# Patient Record
Sex: Female | Born: 1975 | Race: Asian | Hispanic: No | Marital: Married | Smoking: Never smoker
Health system: Southern US, Community
[De-identification: ages and names within clinical notes are randomized; demographics above are authoritative.]

## PROBLEM LIST (undated history)

## (undated) DIAGNOSIS — L811 Chloasma: Secondary | ICD-10-CM

## (undated) HISTORY — DX: Chloasma: L81.1

---

## 2011-07-28 ENCOUNTER — Ambulatory Visit: Payer: Self-pay | Admitting: Family Medicine

## 2019-02-10 ENCOUNTER — Telehealth: Payer: Self-pay | Admitting: Obstetrics and Gynecology

## 2019-02-10 NOTE — Telephone Encounter (Signed)
Patient is schedule for 02/21/19 with [facility] for Mirena replacement

## 2019-02-11 NOTE — Telephone Encounter (Signed)
Patient reschedule to 8:50 on 02/21/19

## 2019-02-16 NOTE — Telephone Encounter (Signed)
Mirena reserved for this patient. 

## 2019-02-21 ENCOUNTER — Ambulatory Visit: Payer: Self-pay | Admitting: Obstetrics and Gynecology

## 2019-02-21 ENCOUNTER — Encounter: Payer: Self-pay | Admitting: Obstetrics and Gynecology

## 2019-02-21 ENCOUNTER — Ambulatory Visit (INDEPENDENT_AMBULATORY_CARE_PROVIDER_SITE_OTHER): Payer: 59 | Admitting: Obstetrics and Gynecology

## 2019-02-21 ENCOUNTER — Other Ambulatory Visit: Payer: Self-pay

## 2019-02-21 VITALS — BP 110/80 | Ht 61.0 in | Wt 118.4 lb

## 2019-02-21 DIAGNOSIS — Z30433 Encounter for removal and reinsertion of intrauterine contraceptive device: Secondary | ICD-10-CM

## 2019-02-21 MED ORDER — LEVONORGESTREL 20 MCG/24HR IU IUD: 1.0000 | INTRAUTERINE_SYSTEM | Freq: Once | INTRAUTERINE | 0 refills | Status: AC

## 2019-02-21 NOTE — Patient Instructions (Signed)
I value your feedback and entrusting us with your care. If you get a [place] patient survey, I would appreciate you taking the time to let us know about your experience today. Thank you!  [facility] [phone]  Instructions after IUD insertion  Most women experience no significant problems after insertion of an IUD, however minor cramping and spotting for a few days is common. Cramps may be treated with ibuprofen 800mg every 8 hours or Tylenol 650 mg every 4 hours. Contact [facility] immediately if you experience any of the following symptoms during the next week: temperature >99.6 degrees, worsening pelvic pain, abdominal pain, fainting, unusually heavy vaginal bleeding, foul vaginal discharge, or if you think you have expelled the IUD.  Nothing inserted in the vagina for 48 hours. You will be scheduled for a follow up visit in approximately four weeks.  You should check monthly to be sure you can feel the IUD strings in the upper vagina. If you are having a monthly period, try to check after each period. If you cannot feel the IUD strings,  contact [facility] immediately so we can do an exam to determine if the IUD has been expelled.   Please use backup protection until we can confirm the IUD is in place.  Call [facility] if you are exposed to or diagnosed with a sexually transmitted infection, as we will need to discuss whether it is safe for you to continue using an IUD.   

## 2019-02-21 NOTE — Progress Notes (Signed)
    Chief Complaint  Patient presents with  . Contraception    Mirena removal/reinsertion     History of Present Illness:  Leah Moran is a 43 y.o. that had a Mirena IUD placed approximately 5 years ago. Since that time, she denies dyspareunia, pelvic pain, non-menstrual bleeding, vaginal d/c, heavy bleeding. She would like a replacement IUD. Past due for annual.   BP 110/80   Ht 5\' 1"  (1.549 m)   Wt 118 lb 6.4 oz (53.7 kg)   BMI 22.37 kg/m   Pelvic exam:  Two IUD strings present seen coming from the cervical os. EGBUS, vaginal vault and cervix: within normal limits  IUD Removal Strings of IUD identified and grasped.  IUD removed without problem with ring forceps.  Pt tolerated this well.  IUD noted to be intact.   IUD Insertion Procedure Note Patient identified, informed consent performed, consent signed.   Discussed risks of irregular bleeding, cramping, infection, malpositioning or misplacement of the IUD outside the uterus which may require further procedure such as laparoscopy, risk of failure <1%. Time out was performed.    Speculum placed in the vagina.  Cervix visualized.  Cleaned with Betadine x 2.  Grasped anteriorly with a single tooth tenaculum.  Uterus sounded to 10.0 cm.   IUD placed per manufacturer's recommendations.  Strings trimmed to 3 cm. Tenaculum was removed, good hemostasis noted.  Patient tolerated procedure well.   ASSESSMENT:  Encounter for removal and reinsertion of intrauterine contraceptive device (IUD) - Plan: levonorgestrel (MIRENA) 20 MCG/24HR IUD   Meds ordered this encounter  Medications  . levonorgestrel (MIRENA) 20 MCG/24HR IUD    Sig: 1 Intra Uterine Device (1 each total) by Intrauterine route once for 1 dose.    Dispense:  1 each    Refill:  0    Order Specific Question:   Supervising Provider    Answer:   Gae Dry [[id number]]     Plan:  Patient was given post-procedure instructions.  She was advised to have backup  contraception for one week.   Call if you are having increasing pain, cramps or bleeding or if you have a fever greater than 100.4 degrees F., shaking chills, nausea or vomiting. Patient was also asked to check IUD strings periodically and follow up in 4 weeks for IUD check.  Return in about 4 weeks (around 03/21/2019) for annual with IUD f/u.  Alicia B. Copland, PA-C 02/21/2019 9:12 AM

## 2019-03-30 NOTE — Progress Notes (Signed)
PCP:  Patient, No Pcp Per   Chief Complaint  Patient presents with  . Gynecologic Exam     HPI:      Ms. Leah Moran is a 43 y.o. [phone] who LMP was No LMP recorded. (Menstrual status: IUD)., presents today for her annual examination.  Her menses are absent with IUD. Dysmenorrhea none. She does not have intermenstrual bleeding.  Sex activity: single partner, contraception - IUD. Mirena replaced 02/21/19. Doing well.  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 There is no FH of breast cancer. There is no FH of ovarian cancer. The patient does do self-breast exams.  Tobacco use: The patient denies current or previous tobacco use. Alcohol use: social drinker No drug use.  Exercise: very active  She does get adequate calcium and Vitamin D in her diet.  No recent fasting labs. Pt with hx of melasma. Needs Rx RF tri-luma crm.  Past Medical History:  Diagnosis Date  . Melasma     History reviewed. No pertinent surgical history.  Family History  Problem Relation Age of Onset  . Other Mother        Ruptured anuerysm  . Hypertension Mother   . Breast cancer Neg Hx   . Ovarian cancer Neg Hx     Social History   Socioeconomic History  . Marital status: Married    Spouse name: Not on file  . Number of children: Not on file  . Years of education: Not on file  . Highest education level: Not on file  Occupational History  . Not on file  Social Needs  . Financial resource strain: Not on file  . Food insecurity    Worry: Not on file    Inability: Not on file  . Transportation needs    Medical: Not on file    Non-medical: Not on file  Tobacco Use  . Smoking status: Never Smoker  . Smokeless tobacco: Never Used  Substance and Sexual Activity  . Alcohol use: Yes  . Drug use: Never  . Sexual activity: Yes    Birth control/protection: I.U.D.   Comment: Mirena  Lifestyle  . Physical activity    Days per week: Not on file    Minutes per session: Not on file  . Stress: Not on file  Relationships  . Social Musicianconnections    Talks on phone: Not on file    Gets together: Not on file    Attends religious service: Not on file    Active member of club or organization: Not on file    Attends meetings of clubs or organizations: Not on file    Relationship status: Not on file  . Intimate partner violence    Fear of current or ex partner: Not on file    Emotionally abused: Not on file    Physically abused: Not on file    Forced sexual activity: Not on file  Other Topics Concern  . Not on file  Social History Narrative  . Not on file    Outpatient Medications Prior to Visit  Medication Sig Dispense Refill  . levonorgestrel (MIRENA) 20 MCG/24HR IUD 1 Intra Uterine Device (1 each total) by Intrauterine route once for 1 dose. 1 each 0   No facility-administered medications prior to visit.       ROS:  Review of  Systems  Constitutional: Negative for fatigue, fever and unexpected weight change.  Respiratory: Negative for cough, shortness of breath and wheezing.   Cardiovascular: Negative for chest pain, palpitations and leg swelling.  Gastrointestinal: Negative for blood in stool, constipation, diarrhea, nausea and vomiting.  Endocrine: Negative for cold intolerance, heat intolerance and polyuria.  Genitourinary: Negative for dyspareunia, dysuria, flank pain, frequency, genital sores, hematuria, menstrual problem, pelvic pain, urgency, vaginal bleeding, vaginal discharge and vaginal pain.  Musculoskeletal: Negative for back pain, joint swelling and myalgias.  Skin: Negative for rash.  Neurological: Negative for dizziness, syncope, light-headedness, numbness and headaches.  Hematological: Negative for adenopathy.  Psychiatric/Behavioral: Negative for agitation, confusion, sleep disturbance and suicidal ideas. The patient is not  nervous/anxious.    BREAST: No symptoms   Objective: BP 100/60   Ht 5' (1.524 m)   Wt 114 lb 9.6 oz (52 kg)   BMI 22.38 kg/m    Physical Exam Constitutional:      Appearance: She is well-developed.  Genitourinary:     Vulva, vagina, cervix, uterus, right adnexa and left adnexa normal.     No vulval lesion or tenderness noted.     No vaginal discharge, erythema or tenderness.     No cervical polyp.     IUD strings visualized.     Uterus is not enlarged or tender.     No right or left adnexal mass present.     Right adnexa not tender.     Left adnexa not tender.  Neck:     Musculoskeletal: Normal range of motion.     Thyroid: No thyromegaly.  Cardiovascular:     Rate and Rhythm: Normal rate and regular rhythm.     Heart sounds: Normal heart sounds. No murmur.  Pulmonary:     Effort: Pulmonary effort is normal.     Breath sounds: Normal breath sounds.  Chest:     Breasts:        Right: No mass, nipple discharge, skin change or tenderness.        Left: No mass, nipple discharge, skin change or tenderness.  Abdominal:     Palpations: Abdomen is soft.     Tenderness: There is no abdominal tenderness. There is no guarding.  Musculoskeletal: Normal range of motion.  Neurological:     General: No focal deficit present.     Mental Status: She is alert and oriented to person, place, and time.     Cranial Nerves: No cranial nerve deficit.  Skin:    General: Skin is warm and dry.  Psychiatric:        Mood and Affect: Mood normal.        Behavior: Behavior normal.        Thought Content: Thought content normal.        Judgment: Judgment normal.  Vitals signs reviewed.     Assessment/Plan: Encounter for annual routine gynecological examination -   Cervical cancer screening - Plan: Cytology - PAP,   Screening for HPV (human papillomavirus) - Plan: Cytology - PAP,   Screening for breast cancer - Plan: MM 3D SCREEN BREAST BILATERAL, Pt to sched mammo  Blood tests for  routine general physical examination - Plan: Comprehensive metabolic panel, Lipid panel,   Screening cholesterol level - Plan: Lipid panel,   Melasma - Plan: Fluocin-Hydroquinone-Tretinoin (TRI-LUMA) 0.01-4-0.05 % CREA, Rx eRdx  Meds ordered this encounter  Medications  . Fluocin-Hydroquinone-Tretinoin (TRI-LUMA) 0.01-4-0.05 % CREA    Sig: AAA nightly for up to 8  wks, then take a short break prn    Dispense:  30 g    Refill:  1    Order Specific Question:   Supervising Provider    Answer:   Gae Dry [[id number]]             GYN counsel breast self exam, mammography screening, adequate intake of calcium and vitamin D     F/U  Return in about 1 year (around 03/30/2020).  Amberia Bayless B. Kerston Landeck, PA-C 03/31/2019 8:38 AM

## 2019-03-30 NOTE — Patient Instructions (Addendum)
I value your feedback and entrusting us with your care. If you get a [facility] patient survey, I would appreciate you taking the time to let us know about your experience today. Thank you!  [facility]: [phone]  [facility]: [phone]  

## 2019-03-31 ENCOUNTER — Ambulatory Visit (INDEPENDENT_AMBULATORY_CARE_PROVIDER_SITE_OTHER): Payer: 59 | Admitting: Obstetrics and Gynecology

## 2019-03-31 ENCOUNTER — Encounter: Payer: Self-pay | Admitting: Obstetrics and Gynecology

## 2019-03-31 ENCOUNTER — Other Ambulatory Visit (HOSPITAL_COMMUNITY)
Admission: RE | Admit: 2019-03-31 | Discharge: 2019-03-31 | Disposition: A | Discharge status: Home / Self Care | Payer: 59 | Source: Ambulatory Visit | Attending: Obstetrics and Gynecology | Admitting: Obstetrics and Gynecology

## 2019-03-31 ENCOUNTER — Other Ambulatory Visit: Payer: Self-pay

## 2019-03-31 VITALS — BP 100/60 | Ht 60.0 in | Wt 114.6 lb

## 2019-03-31 DIAGNOSIS — Z1151 Encounter for screening for human papillomavirus (HPV): Secondary | ICD-10-CM | POA: Insufficient documentation

## 2019-03-31 DIAGNOSIS — Z0000 Encounter for general adult medical examination without abnormal findings: Secondary | ICD-10-CM

## 2019-03-31 DIAGNOSIS — Z01419 Encounter for gynecological examination (general) (routine) without abnormal findings: Secondary | ICD-10-CM

## 2019-03-31 DIAGNOSIS — Z13220 Encounter for screening for lipoid disorders: Secondary | ICD-10-CM

## 2019-03-31 DIAGNOSIS — Z124 Encounter for screening for malignant neoplasm of cervix: Secondary | ICD-10-CM

## 2019-03-31 DIAGNOSIS — Z1239 Encounter for other screening for malignant neoplasm of breast: Secondary | ICD-10-CM

## 2019-03-31 DIAGNOSIS — L811 Chloasma: Secondary | ICD-10-CM

## 2019-03-31 MED ORDER — TRI-LUMA 0.01-4-0.05 % EX CREA: TOPICAL_CREAM | CUTANEOUS | 1 refills | Status: DC

## 2019-04-01 LAB — COMPREHENSIVE METABOLIC PANEL
ALT: 11 IU/L (ref 0–32)
AST: 16 IU/L (ref 0–40)
Albumin/Globulin Ratio: 2.3 — ABNORMAL HIGH (ref 1.2–2.2)
Albumin: 4.8 g/dL (ref 3.8–4.8)
Alkaline Phosphatase: 47 IU/L (ref 39–117)
BUN/Creatinine Ratio: 14 (ref 9–23)
BUN: 13 mg/dL (ref 6–24)
Bilirubin Total: 0.6 mg/dL (ref 0.0–1.2)
CO2: 22 mmol/L (ref 20–29)
Calcium: 9.2 mg/dL (ref 8.7–10.2)
Chloride: 103 mmol/L (ref 96–106)
Creatinine, Ser: 0.91 mg/dL (ref 0.57–1.00)
GFR calc Af Amer: 90 mL/min/{1.73_m2} (ref >59)
GFR calc non Af Amer: 78 mL/min/{1.73_m2} (ref >59)
Globulin, Total: 2.1 g/dL (ref 1.5–4.5)
Glucose: 97 mg/dL (ref 65–99)
Potassium: 3.7 mmol/L (ref 3.5–5.2)
Sodium: 141 mmol/L (ref 134–144)
Total Protein: 6.9 g/dL (ref 6.0–8.5)

## 2019-04-01 LAB — LIPID PANEL
Chol/HDL Ratio: 3.1 ratio (ref 0.0–4.4)
Cholesterol, Total: 224 mg/dL — ABNORMAL HIGH (ref 100–199)
HDL: 72 mg/dL (ref >39)
LDL Calculated: 137 mg/dL — ABNORMAL HIGH (ref 0–99)
Triglycerides: 74 mg/dL (ref 0–149)
VLDL Cholesterol Cal: 15 mg/dL (ref 5–40)

## 2019-04-05 ENCOUNTER — Encounter: Payer: Self-pay | Admitting: Obstetrics and Gynecology

## 2019-04-05 LAB — CYTOLOGY - PAP
Diagnosis: UNDETERMINED — AB
HPV: NOT DETECTED

## 2019-04-14 NOTE — Telephone Encounter (Signed)
Rcvd/charge Mirena, Removal & reinsert 02/21/2019

## 2019-04-21 ENCOUNTER — Other Ambulatory Visit: Payer: Self-pay

## 2019-04-21 ENCOUNTER — Ambulatory Visit
Admission: RE | Admit: 2019-04-21 | Discharge: 2019-04-21 | Disposition: A | Discharge status: Home / Self Care | Payer: 59 | Source: Ambulatory Visit | Attending: Obstetrics and Gynecology | Admitting: Obstetrics and Gynecology

## 2019-04-21 DIAGNOSIS — Z1231 Encounter for screening mammogram for malignant neoplasm of breast: Secondary | ICD-10-CM | POA: Insufficient documentation

## 2019-04-21 DIAGNOSIS — Z1239 Encounter for other screening for malignant neoplasm of breast: Secondary | ICD-10-CM

## 2019-04-22 ENCOUNTER — Encounter: Payer: Self-pay | Admitting: Obstetrics and Gynecology

## 2020-09-25 DIAGNOSIS — Z20828 Contact with and (suspected) exposure to other viral communicable diseases: Secondary | ICD-10-CM | POA: Diagnosis not present

## 2020-11-02 IMAGING — MG DIGITAL SCREENING BILATERAL MAMMOGRAM WITH TOMO AND CAD
8 series · 9 of 24 positions shown · non-contrast
Comparison: Previous exam(s).

CLINICAL DATA: Screening.

EXAM:
DIGITAL SCREENING BILATERAL MAMMOGRAM WITH TOMO AND CAD

[R CC synth-2D]
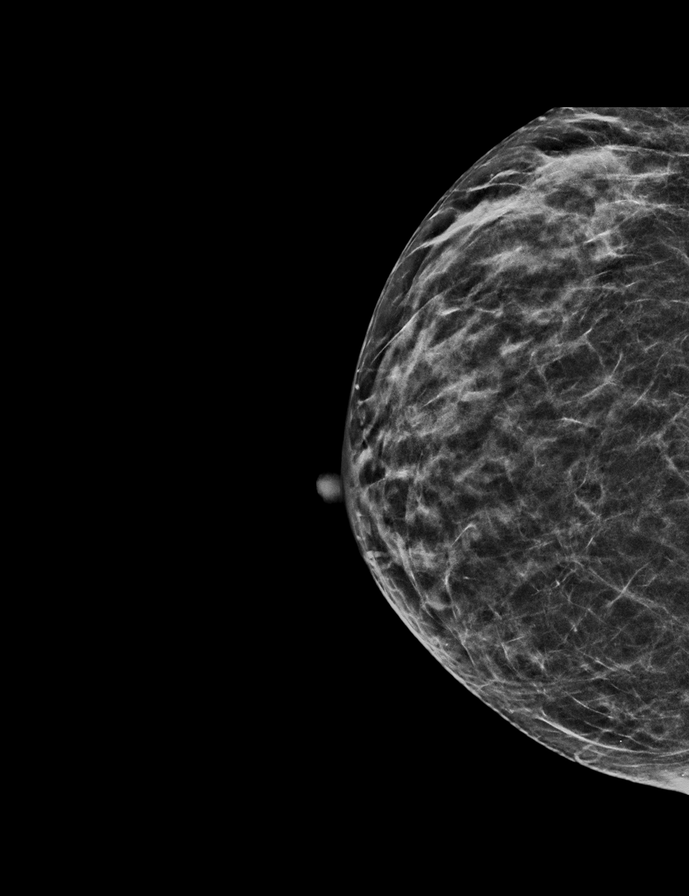

[R MLO synth-2D]
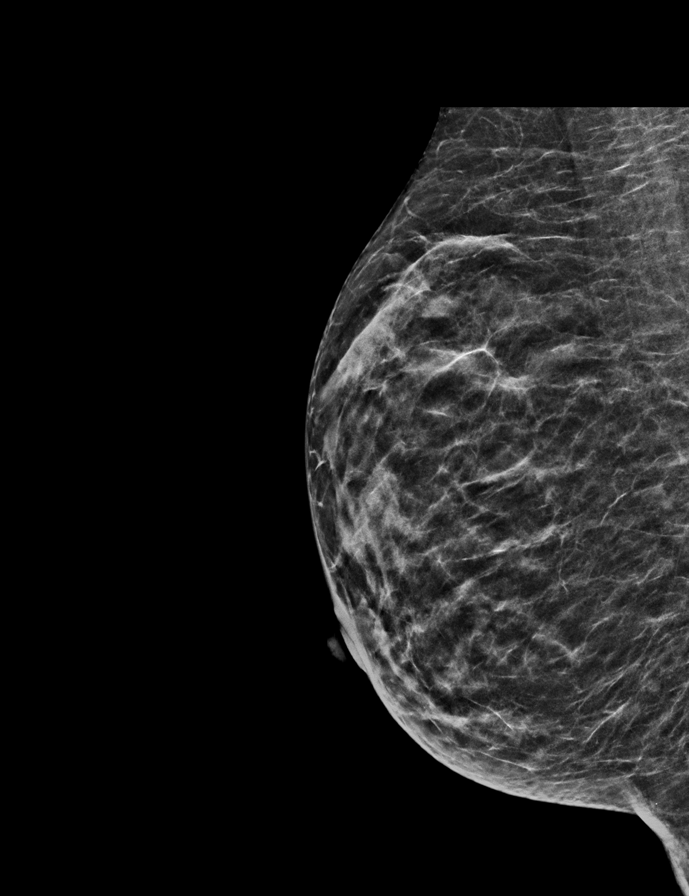

[L MLO synth-2D]
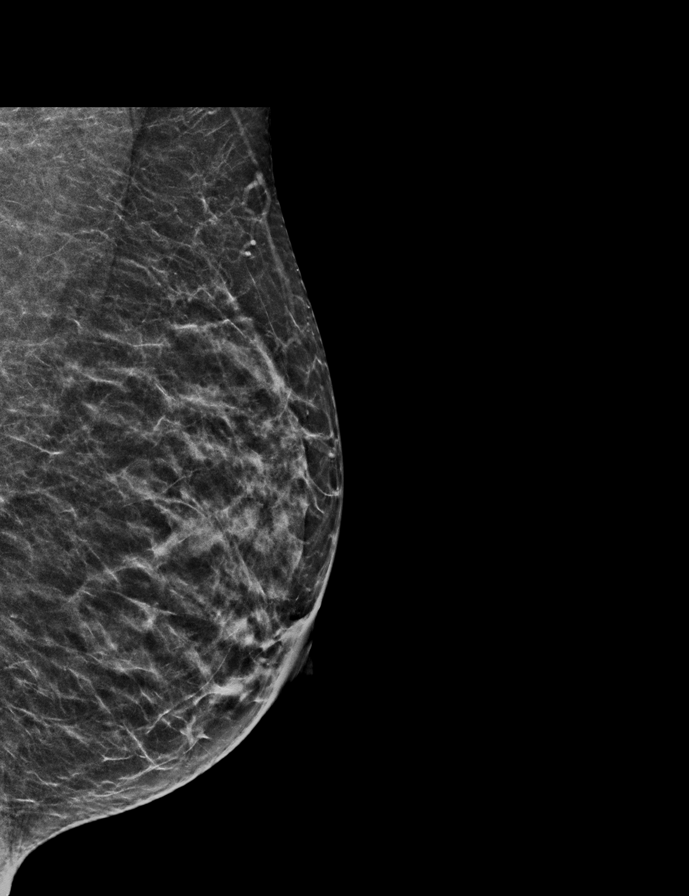

[L CC synth-2D]
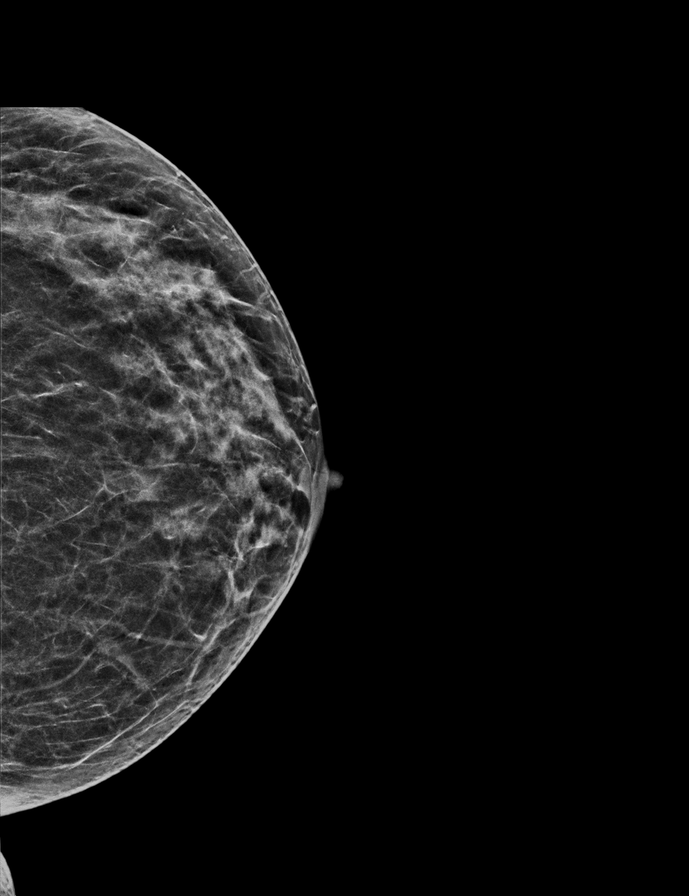

[R CC tomo · 2 of 45 frames shown]
[frame 15/45]
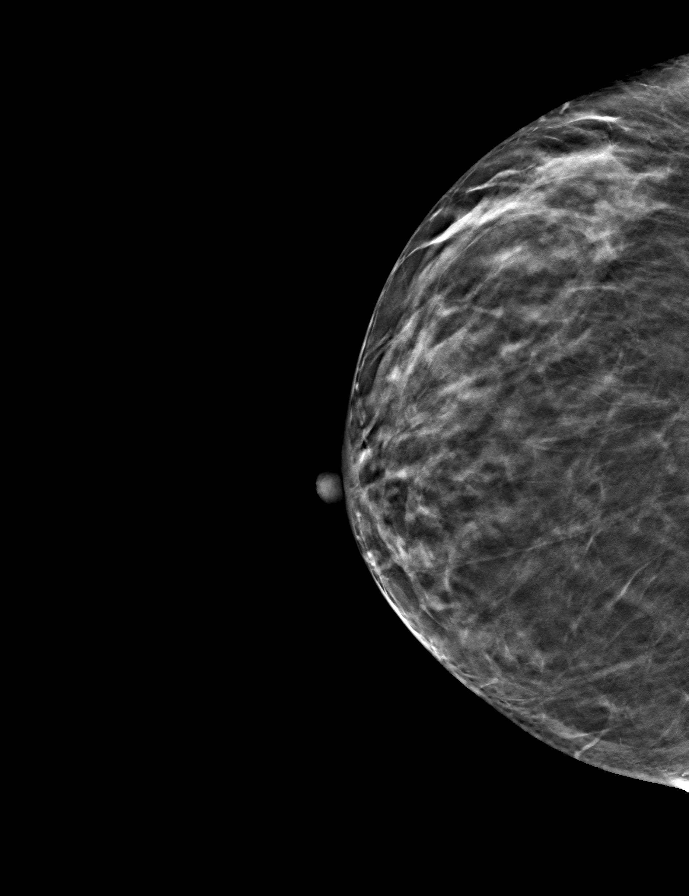
[frame 23/45]
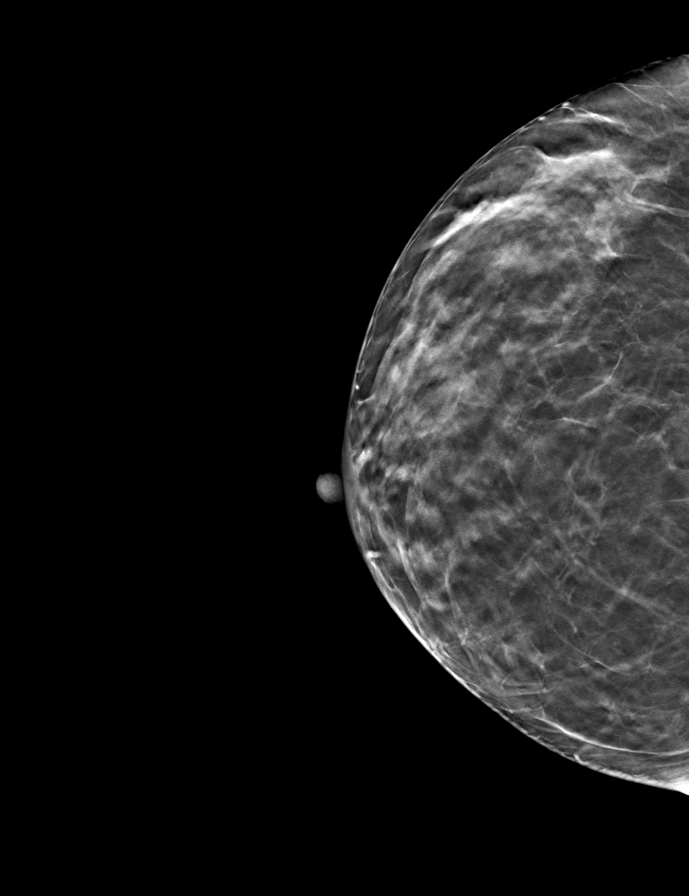

[L MLO tomo · tomo slice 26/51.0]
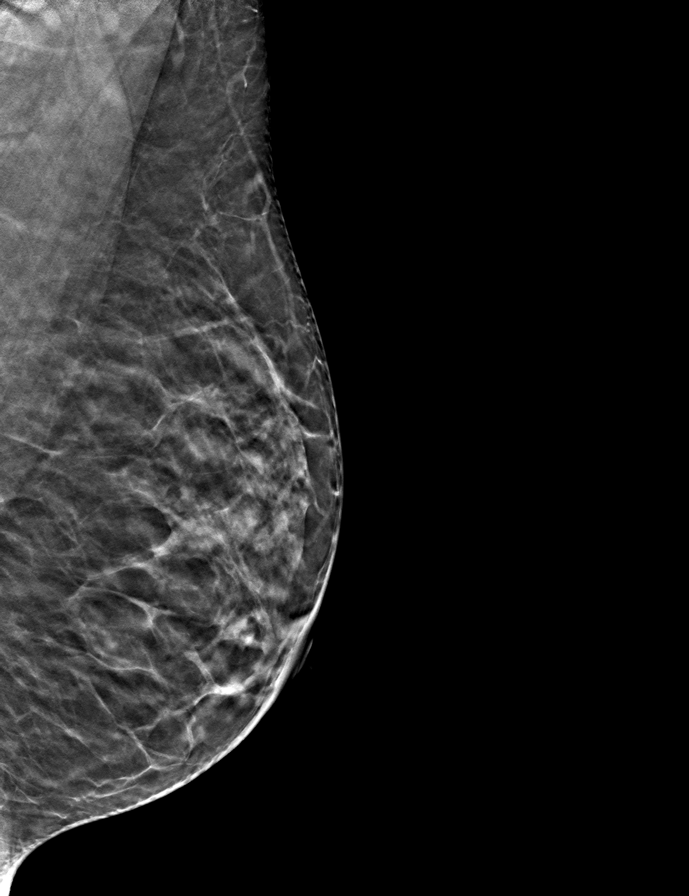

[R MLO tomo · tomo slice 23/46.0]
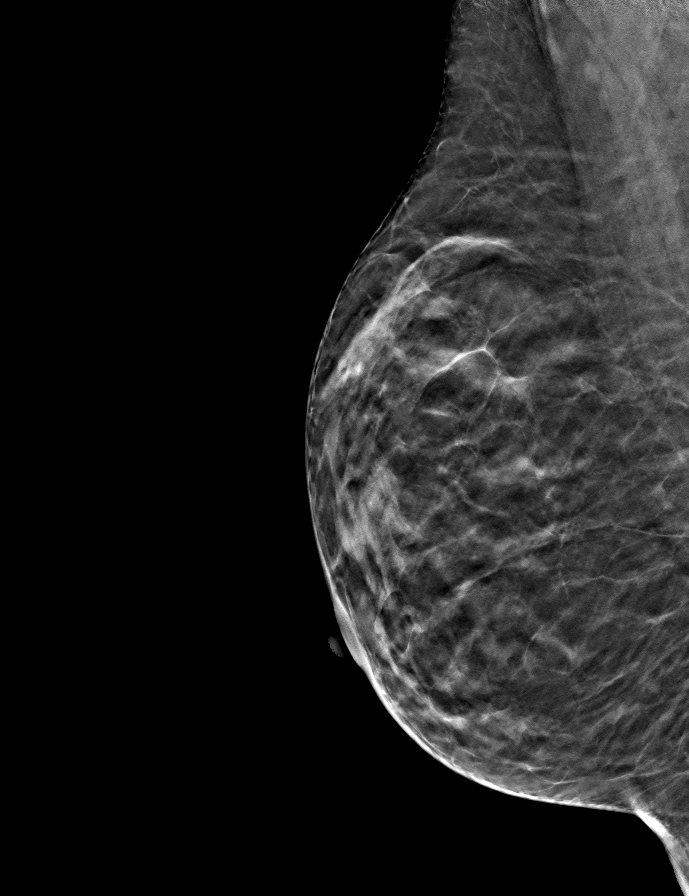

[L CC tomo · tomo slice 25/48.0]
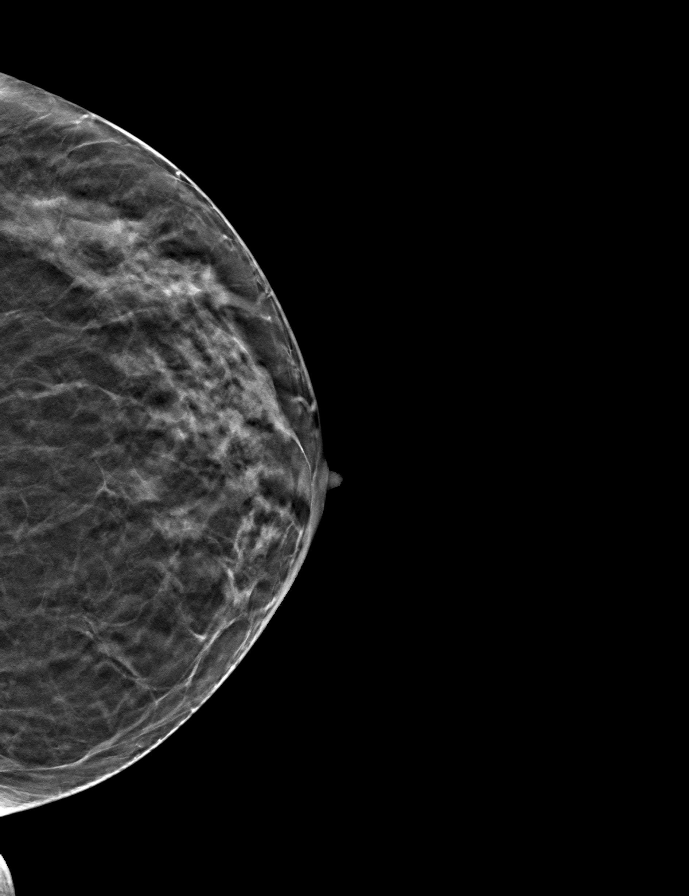

[9 of 24 positions shown; findings below may reference images not displayed]

ACR Breast Density Category c: The breast tissue is heterogeneously
dense, which may obscure small masses.
FINDINGS: There are no findings suspicious for malignancy. Images were
processed with CAD.
IMPRESSION: No mammographic evidence of malignancy. A result letter of this
screening mammogram will be mailed directly to the patient.

RECOMMENDATION:
Screening mammogram in one year. (Code:FT-U-LHB)

BI-RADS CATEGORY  1: Negative.

## 2021-03-12 ENCOUNTER — Ambulatory Visit (INDEPENDENT_AMBULATORY_CARE_PROVIDER_SITE_OTHER): Payer: BC Managed Care – PPO | Admitting: Family Medicine

## 2021-03-12 ENCOUNTER — Encounter (HOSPITAL_BASED_OUTPATIENT_CLINIC_OR_DEPARTMENT_OTHER): Payer: Self-pay | Admitting: Family Medicine

## 2021-03-12 ENCOUNTER — Other Ambulatory Visit: Payer: Self-pay

## 2021-03-12 DIAGNOSIS — R079 Chest pain, unspecified: Secondary | ICD-10-CM | POA: Diagnosis not present

## 2021-03-12 DIAGNOSIS — E785 Hyperlipidemia, unspecified: Secondary | ICD-10-CM

## 2021-03-12 NOTE — Progress Notes (Signed)
New Patient Office Visit  Subjective:  Patient ID: Leah Moran, female    DOB: 1976/05/18  Age: 45 y.o. MRN: [id number]  CC:  Chief Complaint  Patient presents with   Establish Care    Patient presents to establish care. Patient admits to having OB/GYN acting as PCP previously   Chest Pain    Patient presents with concerns of intermittent chest pain for the last 6 months. Patient states she is an avid runner and notices 2-3 times weekly when she is running she will get a sharp shooting pain in the left center of her chest. Pain is not radiating. Patient denies any dizziness, shortness of breath, lightheadedness, or other associated symptoms.    HPI Leah BALLENGEE is a 45 year old female presenting to establish in clinic.  She reports current issues with infrequent chest pain.  Otherwise she denies significant past medical history, no prior hospitalizations or surgeries.  Chest pain: She has noticed this over the past 6 months approximately.  Will occur may be twice per week and last less than 30 minutes.  She denies any associated symptoms with the chest pain such as shortness of breath, lightheadedness or dizziness.  Patient runs fairly regularly, completing about 6 to 10 miles per week.  She will occasionally notice the pain when she is running, she will also noticed that at other times the day both with exertion and without exertion.  She denies any family history of cardiac conditions or death from an unknown cause at a young age.  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  She has been living locally for about 13 years.  Present activity primarily includes running.  Runs about 6 to 10 miles per week.  Past Medical History:  Diagnosis Date   Melasma     History reviewed. No pertinent surgical history.  Family History  Problem Relation Age of Onset   Other Mother        Ruptured anuerysm   Hypertension Mother    Anuerysm Mother    Anuerysm Maternal Grandmother     Cancer Paternal Grandmother    Breast cancer Neg Hx    Ovarian cancer Neg Hx     Social History   Socioeconomic History   Marital status: Married    Spouse name: Not on file   Number of children: Not on file   Years of education: Not on file   Highest education level: Not on file  Occupational History   Not on file  Tobacco Use   Smoking status: Never    Passive exposure: Never   Smokeless tobacco: Never  Vaping Use   Vaping Use: Never used  Substance and Sexual Activity   Alcohol use: Yes    Alcohol/week: 1.0 - 2.0 standard drink    Types: 1 - 2 Standard drinks or equivalent per week    Comment: socially on weekends   Drug use: Never   Sexual activity: Yes    Birth control/protection: I.U.D.    Comment: Mirena  Other Topics Concern   Not on file  Social History Narrative   Not on file   Social Determinants of Health   Financial Resource Strain: Not on file  Food Insecurity: Not on file  Transportation Needs: Not on file  Physical Activity: Not on file  Stress: Not on file  Social Connections: Not on file  Intimate Partner Violence: Not on file    Objective:   Today's Vitals: BP 122/64   Pulse  67   Ht 5' (1.524 m)   Wt 115 lb 12.8 oz (52.5 kg)   SpO2 98%   BMI 22.62 kg/m   Physical Exam  Pleasant 45 year old female in no acute distress Cardiovascular exam with regular rate and rhythm, no murmurs appreciated Lungs clear to auscultation bilaterally  Assessment & Plan:   Problem List Items Addressed This Visit       Other   Hyperlipidemia    Found to have elevated total cholesterol and LDL on labs completed about 2 years ago Will check lipid panel today Did discuss general lifestyle modifications including diet and exercise.       Relevant Orders   Lipid panel   Chest pain    Uncertain etiology, atypical in nature.  Possible precordial catch syndrome. Will check labs including CBC, BMP, lipid panel Discussed possible EKG, patient wishes  to hold off for now Advised to monitor symptoms and return to clinic if symptoms worsen in severity or become more frequent or last longer in duration       Relevant Orders   CBC with Differential/Platelet   Basic metabolic panel    Outpatient Encounter Medications as of 03/12/2021  Medication Sig   levonorgestrel (MIRENA) 20 MCG/24HR IUD 1 Intra Uterine Device (1 each total) by Intrauterine route once for 1 dose.   [DISCONTINUED] Fluocin-Hydroquinone-Tretinoin (TRI-LUMA) 0.01-4-0.05 % CREA AAA nightly for up to 8 wks, then take a short break prn   No facility-administered encounter medications on file as of 03/12/2021.    Follow-up: Return in about 1 year (around 03/12/2022) for Follow Up.   Leah Harold J De [place], MD

## 2021-03-12 NOTE — Patient Instructions (Signed)
  Medication Instructions:  Your physician recommends that you continue on your current medications as directed. Please refer to the Current Medication list given to you today. --If you need a refill on any your medications before your next appointment, please call your pharmacy first. If no refills are authorized on file call the office.--  Lab Work: Your physician has recommended that you have lab work today: CBC, BMET, and Lipid Profile If you have labs (blood work) drawn today and your tests are completely normal, you will receive your results via MyChart message OR a phone call from our staff.  Please ensure you check your voicemail in the event that you authorized detailed messages to be left on a delegated number. If you have any lab test that is abnormal or we need to change your treatment, we will call you to review the results.  Follow-Up: Your next appointment:   Your physician recommends that you schedule a follow-up appointment in: 1 YEAR with Dr. de Cuba  Thanks for letting us be apart of your health journey!!  Primary Care and Sports Medicine   Dr. Raymond de Cuba   We encourage you to activate your patient portal called "MyChart".  Sign up information is provided on this After Visit Summary.  MyChart is used to connect with patients for Virtual Visits (Telemedicine).  Patients are able to view lab/test results, encounter notes, upcoming appointments, etc.  Non-urgent messages can be sent to your provider as well. To learn more about what you can do with MyChart, please visit --  [email or web address].    

## 2021-03-12 NOTE — Assessment & Plan Note (Signed)
Found to have elevated total cholesterol and LDL on labs completed about 2 years ago Will check lipid panel today Did discuss general lifestyle modifications including diet and exercise.

## 2021-03-12 NOTE — Assessment & Plan Note (Signed)
Uncertain etiology, atypical in nature.  Possible precordial catch syndrome. Will check labs including CBC, BMP, lipid panel Discussed possible EKG, patient wishes to hold off for now Advised to monitor symptoms and return to clinic if symptoms worsen in severity or become more frequent or last longer in duration

## 2021-03-13 LAB — CBC WITH DIFFERENTIAL/PLATELET
Basophils Absolute: 0 10*3/uL (ref 0.0–0.2)
Basos: 1 %
EOS (ABSOLUTE): 0.1 10*3/uL (ref 0.0–0.4)
Eos: 1 %
Hematocrit: 42.3 % (ref 34.0–46.6)
Hemoglobin: 14.2 g/dL (ref 11.1–15.9)
Immature Grans (Abs): 0 10*3/uL (ref 0.0–0.1)
Immature Granulocytes: 1 %
Lymphocytes Absolute: 2.3 10*3/uL (ref 0.7–3.1)
Lymphs: 53 %
MCH: 29.8 pg (ref 26.6–33.0)
MCHC: 33.6 g/dL (ref 31.5–35.7)
MCV: 89 fL (ref 79–97)
Monocytes Absolute: 0.4 10*3/uL (ref 0.1–0.9)
Monocytes: 9 %
Neutrophils Absolute: 1.5 10*3/uL (ref 1.4–7.0)
Neutrophils: 35 %
Platelets: 309 10*3/uL (ref 150–450)
RBC: 4.76 x10E6/uL (ref 3.77–5.28)
RDW: 12.1 % (ref 11.7–15.4)
WBC: 4.3 10*3/uL (ref 3.4–10.8)

## 2021-03-13 LAB — BASIC METABOLIC PANEL
BUN/Creatinine Ratio: 14 (ref 9–23)
BUN: 10 mg/dL (ref 6–24)
CO2: 22 mmol/L (ref 20–29)
Calcium: 9.4 mg/dL (ref 8.7–10.2)
Chloride: 98 mmol/L (ref 96–106)
Creatinine, Ser: 0.71 mg/dL (ref 0.57–1.00)
Glucose: 95 mg/dL (ref 65–99)
Potassium: 4.1 mmol/L (ref 3.5–5.2)
Sodium: 137 mmol/L (ref 134–144)
eGFR: 107 mL/min/{1.73_m2} (ref >59)

## 2021-03-13 LAB — LIPID PANEL
Chol/HDL Ratio: 3.7 ratio (ref 0.0–4.4)
Cholesterol, Total: 214 mg/dL — ABNORMAL HIGH (ref 100–199)
HDL: 58 mg/dL (ref >39)
LDL Chol Calc (NIH): 139 mg/dL — ABNORMAL HIGH (ref 0–99)
Triglycerides: 97 mg/dL (ref 0–149)
VLDL Cholesterol Cal: 17 mg/dL (ref 5–40)

## 2021-03-25 ENCOUNTER — Telehealth (HOSPITAL_BASED_OUTPATIENT_CLINIC_OR_DEPARTMENT_OTHER): Payer: Self-pay

## 2021-03-25 NOTE — Telephone Encounter (Signed)
-----   Message from Hosie Poisson Peru, MD sent at 03/13/2021  1:35 PM EDT ----- Normal white blood cell and red blood cell counts with normal hemoglobin.  Electrolytes, kidney function are normal. Lipid panel with elevated total cholesterol and elevated LDL.  Would initially focus on lifestyle modifications to address cholesterol issues.  This will include dietary changes - incorporating fresh fruits and vegetables, lean protein in the diet and reducing consumption of red meats, saturated fats, processed foods.  Recommend continuing with regular aerobic exercise - goal of about 150 minutes/week.

## 2021-03-25 NOTE — Telephone Encounter (Signed)
Per DPR left detailed message on mobile voicemail Instructed patient to contact office with any questions or concerns.

## 2021-08-15 ENCOUNTER — Encounter: Payer: Self-pay | Admitting: Obstetrics and Gynecology

## 2021-08-15 DIAGNOSIS — Z1231 Encounter for screening mammogram for malignant neoplasm of breast: Secondary | ICD-10-CM | POA: Diagnosis not present

## 2022-03-13 ENCOUNTER — Encounter (HOSPITAL_BASED_OUTPATIENT_CLINIC_OR_DEPARTMENT_OTHER): Payer: BC Managed Care – PPO | Admitting: Family Medicine

## 2022-07-12 DIAGNOSIS — J45901 Unspecified asthma with (acute) exacerbation: Secondary | ICD-10-CM | POA: Diagnosis not present

## 2023-04-02 ENCOUNTER — Encounter: Payer: Self-pay | Admitting: Obstetrics and Gynecology

## 2023-04-02 DIAGNOSIS — Z1231 Encounter for screening mammogram for malignant neoplasm of breast: Secondary | ICD-10-CM | POA: Diagnosis not present

## 2023-04-02 DIAGNOSIS — R92323 Mammographic fibroglandular density, bilateral breasts: Secondary | ICD-10-CM | POA: Diagnosis not present

## 2024-06-02 ENCOUNTER — Ambulatory Visit: Payer: Self-pay

## 2024-06-02 DIAGNOSIS — Z23 Encounter for immunization: Secondary | ICD-10-CM

## 2024-06-02 DIAGNOSIS — Z719 Counseling, unspecified: Secondary | ICD-10-CM

## 2024-06-02 NOTE — Progress Notes (Signed)
 In nurse clinic today for immunizations today MMR vaccine. Asked about shingrix and counseled age of 61 or older or if immunocompromised provider could write documentation for vaccine. VIS given and copies of [facility] provided.

## 2024-10-18 ENCOUNTER — Ambulatory Visit: Payer: Self-pay

## 2024-10-20 ENCOUNTER — Ambulatory Visit: Payer: Self-pay

## 2025-01-19 ENCOUNTER — Ambulatory Visit: Payer: Self-pay
